# Patient Record
Sex: Female | Born: 1955 | Race: Black or African American | Hispanic: No | Smoking: Former smoker
Health system: Southern US, Community
[De-identification: ages and names within clinical notes are randomized; demographics above are authoritative.]

## PROBLEM LIST (undated history)

## (undated) DIAGNOSIS — I10 Essential (primary) hypertension: Secondary | ICD-10-CM

## (undated) DIAGNOSIS — E079 Disorder of thyroid, unspecified: Secondary | ICD-10-CM

## (undated) HISTORY — PX: KNEE SURGERY: SHX244

## (undated) HISTORY — PX: ABDOMINAL HYSTERECTOMY: SHX81

## (undated) HISTORY — PX: CHOLECYSTECTOMY: SHX55

---

## 2004-08-21 ENCOUNTER — Ambulatory Visit: Payer: Self-pay | Admitting: Family Medicine

## 2005-05-29 ENCOUNTER — Ambulatory Visit: Payer: Self-pay | Admitting: Psychiatry

## 2005-09-23 ENCOUNTER — Ambulatory Visit: Payer: Self-pay | Admitting: Gastroenterology

## 2006-02-03 ENCOUNTER — Ambulatory Visit: Payer: Self-pay | Admitting: Family Medicine

## 2006-04-27 ENCOUNTER — Ambulatory Visit: Payer: Self-pay

## 2008-09-13 ENCOUNTER — Ambulatory Visit: Payer: Self-pay | Admitting: Family Medicine

## 2009-09-19 ENCOUNTER — Ambulatory Visit: Payer: Self-pay | Admitting: Internal Medicine

## 2009-12-24 ENCOUNTER — Ambulatory Visit: Payer: Self-pay | Admitting: Internal Medicine

## 2011-06-21 ENCOUNTER — Ambulatory Visit: Payer: Self-pay

## 2011-06-21 LAB — URINALYSIS, COMPLETE
Bacteria: NEGATIVE
Bilirubin,UR: NEGATIVE
Blood: NEGATIVE
Glucose,UR: NEGATIVE mg/dL (ref 0–75)
Ketone: NEGATIVE
Leukocyte Esterase: NEGATIVE
Nitrite: NEGATIVE
Ph: 6 (ref 4.5–8.0)
Protein: NEGATIVE
RBC,UR: NONE SEEN /HPF (ref 0–5)
Specific Gravity: 1.015 (ref 1.003–1.030)

## 2011-07-22 ENCOUNTER — Ambulatory Visit: Payer: Self-pay | Admitting: Family Medicine

## 2011-09-10 ENCOUNTER — Other Ambulatory Visit: Payer: Self-pay | Admitting: Neurosurgery

## 2011-09-10 DIAGNOSIS — M545 Low back pain: Secondary | ICD-10-CM

## 2012-06-03 ENCOUNTER — Emergency Department: Payer: Self-pay | Admitting: Emergency Medicine

## 2013-09-08 IMAGING — CR DG LUMBAR SPINE 2-3V
1 series · 3 of 3 positions shown · non-contrast
Comparison: none

REASON FOR EXAM: back pain
COMMENTS:

PROCEDURE:     DXR - DXR LUMBAR SPINE AP AND LATERAL  - June 03, 2012  [DATE]
RESULT:     Five non-rib bearing lumbar vertebral bodies are appreciated.
There is no evidence of fracture, dislocation or malalignment.

[Series 1: ap · 0.17mm/px · 3 of 3 slices shown]
[im 1/3]
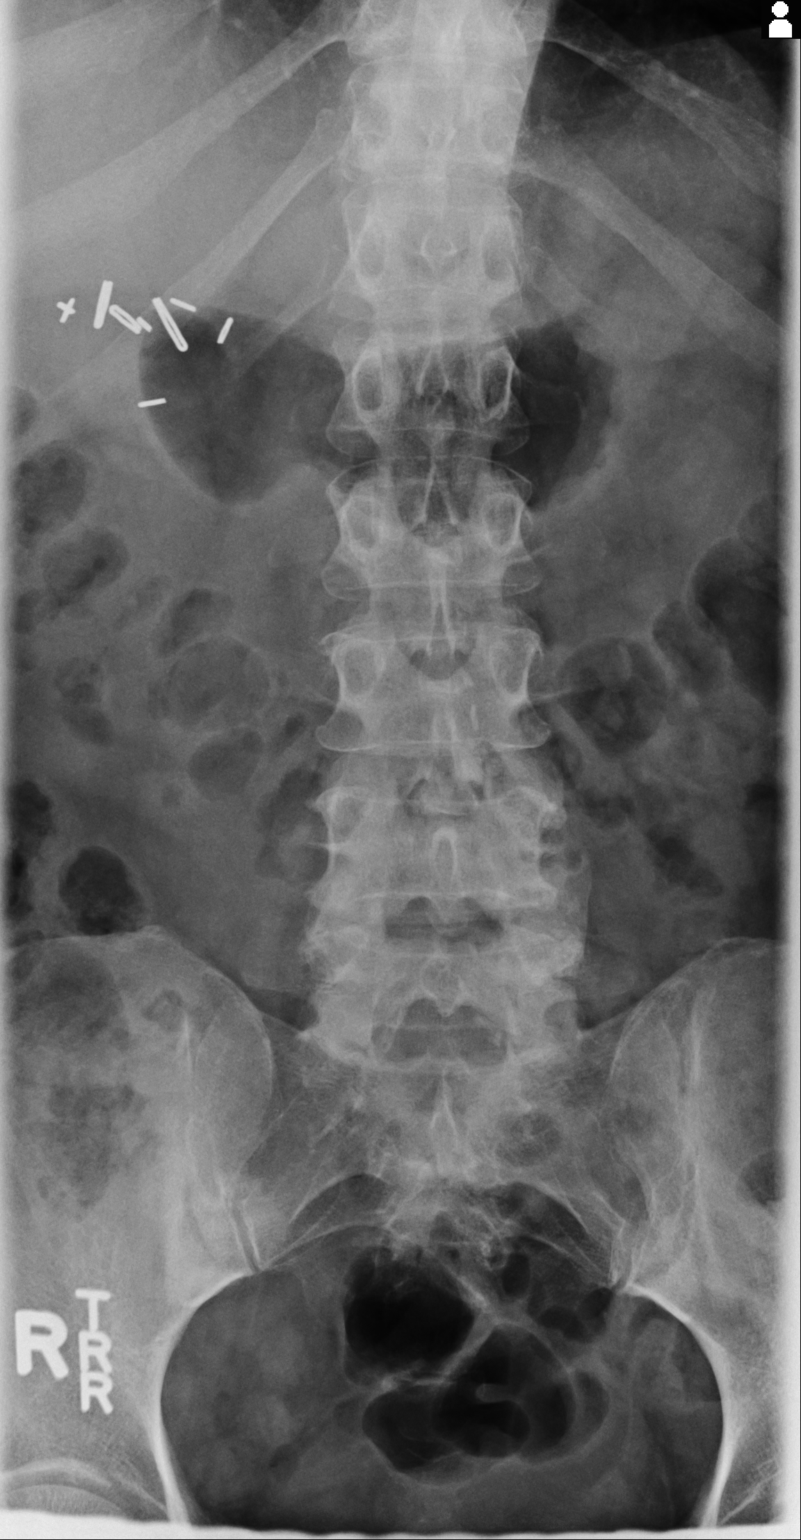
[im 2/3]
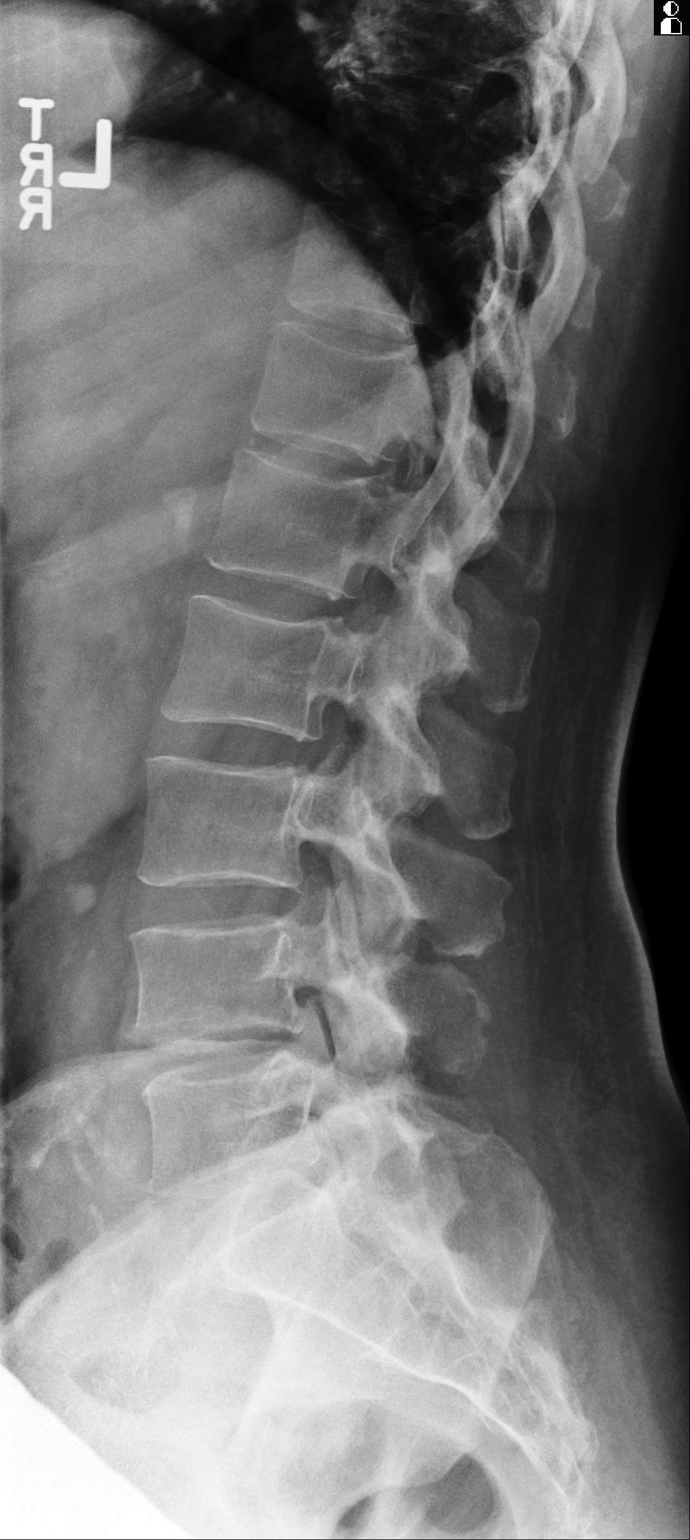
[im 3/3]
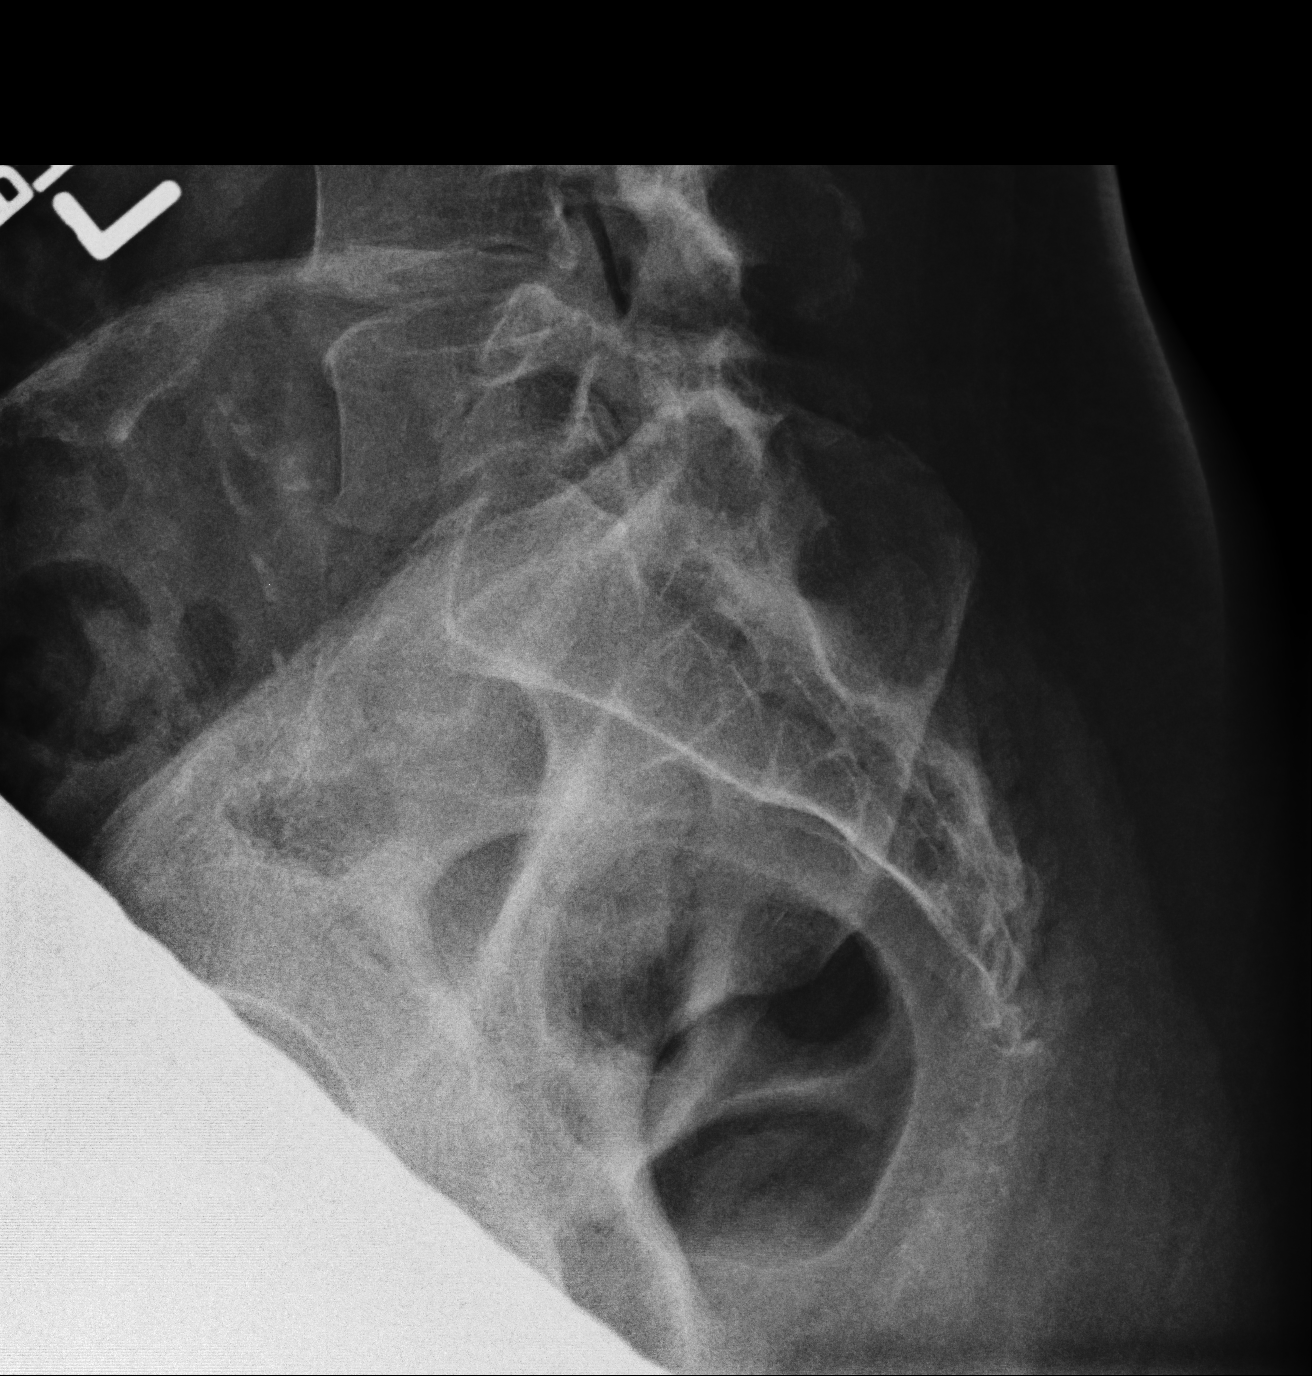

[3 of 3 positions shown; findings below may reference images not displayed]

IMPRESSION: 1. No acute osseous abnormalities.
2. If there is persistent clinical concern or persistent complaints of pain,
further evaluation with MRI is recommended.
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
## 2013-09-08 IMAGING — CR DG CHEST 2V
1 series · 2 of 2 positions shown · non-contrast
Comparison: none

REASON FOR EXAM: chest wall pain
COMMENTS:

PROCEDURE:     DXR - DXR CHEST PA (OR AP) AND LATERAL  - June 03, 2012  [DATE]
RESULT:     The lungs are clear. The cardiac silhouette and visualized bony
skeleton are unremarkable.

[Series 1: pa · 0.17mm/px · 2 of 2 slices shown]
[im 1/2]
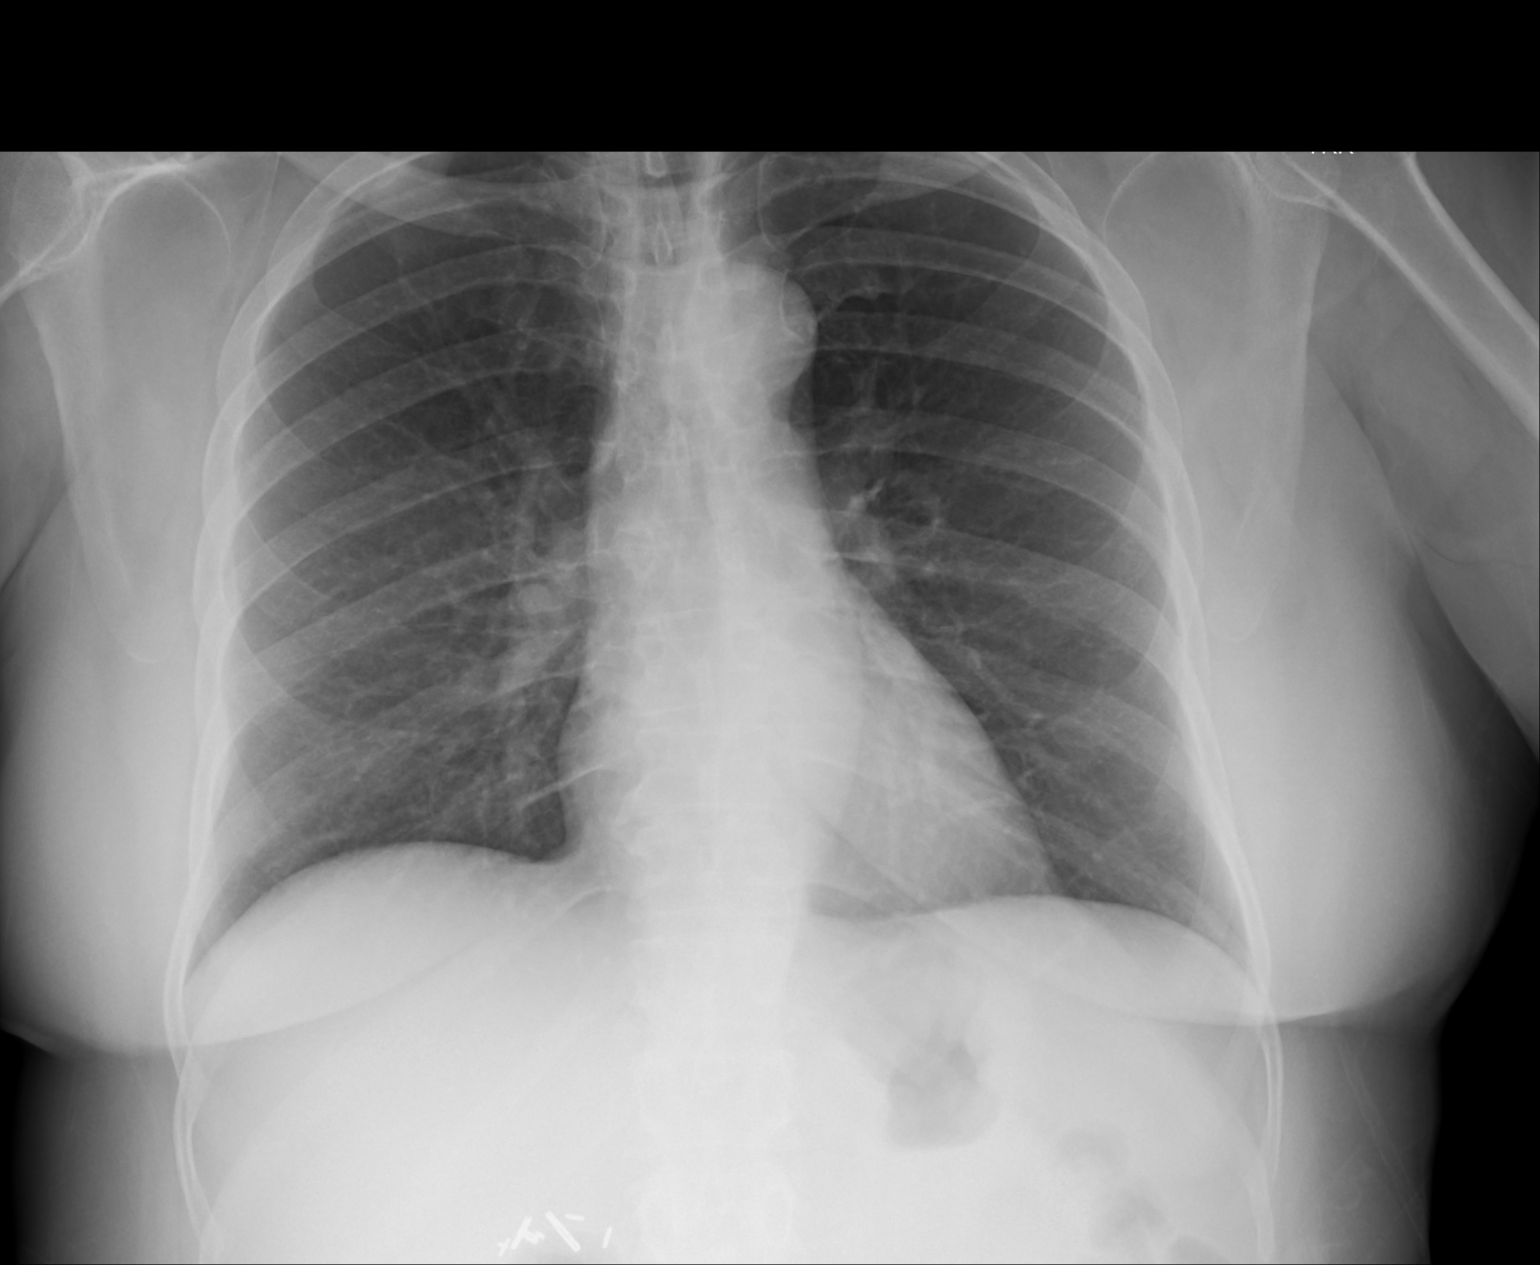
[im 2/2]
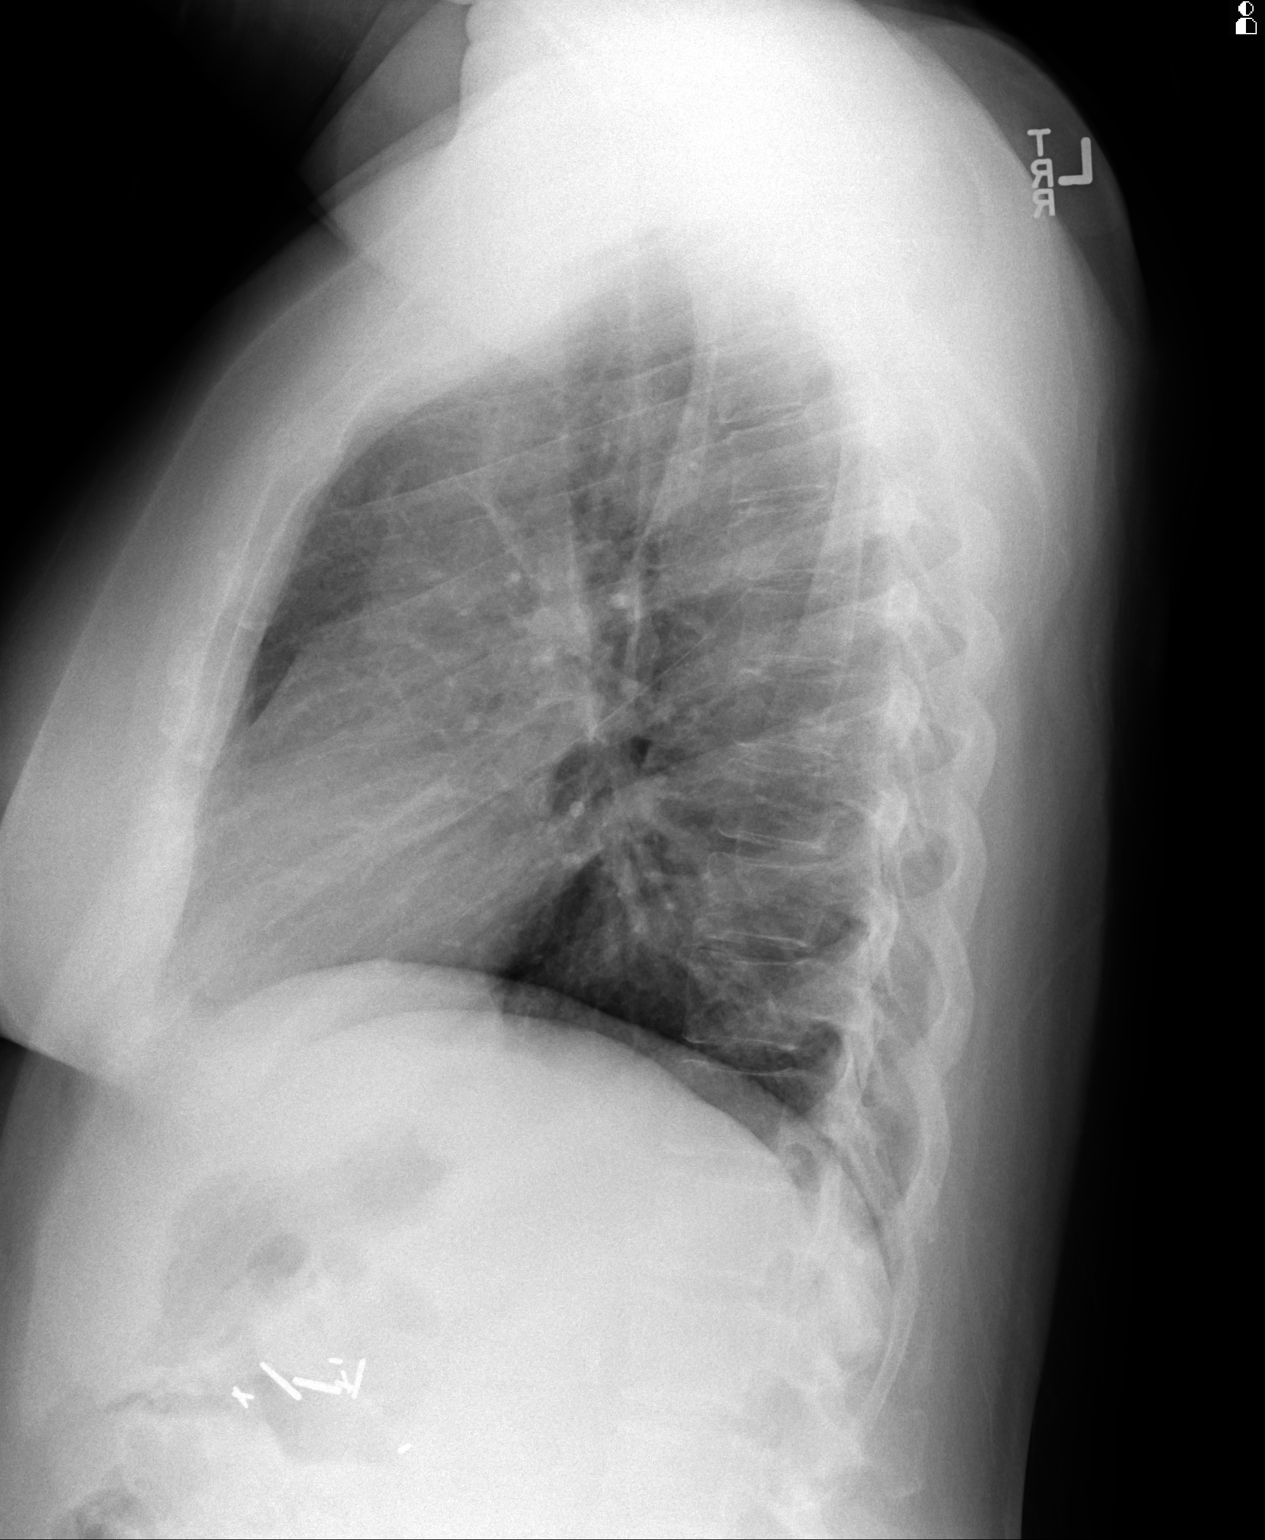

[2 of 2 positions shown; findings below may reference images not displayed]

IMPRESSION: 1. Chest radiograph without evidence of acute cardiopulmonary disease.
2. Comparison made to prior study dated 12/24/2009

## 2015-03-01 ENCOUNTER — Ambulatory Visit: Payer: Self-pay | Admitting: Skilled Nursing Facility1

## 2017-04-16 ENCOUNTER — Other Ambulatory Visit: Payer: Self-pay

## 2017-04-16 ENCOUNTER — Encounter: Payer: Self-pay | Admitting: *Deleted

## 2017-04-16 ENCOUNTER — Ambulatory Visit
Admission: EM | Admit: 2017-04-16 | Discharge: 2017-04-16 | Disposition: A | Discharge status: Home / Self Care | Payer: BC Managed Care – PPO | Attending: Family Medicine | Admitting: Family Medicine

## 2017-04-16 DIAGNOSIS — S46812A Strain of other muscles, fascia and tendons at shoulder and upper arm level, left arm, initial encounter: Secondary | ICD-10-CM

## 2017-04-16 HISTORY — DX: Disorder of thyroid, unspecified: E07.9

## 2017-04-16 HISTORY — DX: Essential (primary) hypertension: I10

## 2017-04-16 MED ORDER — CYCLOBENZAPRINE HCL 10 MG PO TABS: 10.0000 mg | ORAL_TABLET | Freq: Two times a day (BID) | ORAL | 0 refills | Status: DC | PRN

## 2017-04-16 MED ORDER — MELOXICAM 7.5 MG PO TABS: 7.5000 mg | ORAL_TABLET | Freq: Every day | ORAL | 0 refills | Status: DC

## 2017-04-16 NOTE — Discharge Instructions (Signed)
Take medication as prescribed. Rest. Drink plenty of fluids. Stretch.  ° °Follow up with your primary care physician this week as needed. Return to Urgent care for new or worsening concerns.  ° °

## 2017-04-16 NOTE — ED Provider Notes (Addendum)
[facility] ____________________________________________  Time seen: Approximately 10:00 AM  I have reviewed the triage vital signs and the nursing notes.   HISTORY  Chief Complaint Torticollis   HPI Cheryl Ramirez is a 61 y.o. female presenting for evaluation of left-sided neck pain that is been present for the last 2-3 days.  Patient reports pain is increased with trying to look down as well as to look over her left shoulder, and sometimes up.  States she has limited ability to look all the way left due to pain and left neck.  States pain improves at rest and again worsens with movement.  Denies known trigger.  States has been intermittently carrying her grandkids like normal, as well as been very busy and carrying a very heavy purse.  Denies any pain radiation, paresthesias, unilateral weakness, atypical headache, vision changes, shortness of breath, current chest pain, abdominal pain, atypical back pain.  Patient states that she did take some over-the-counter ibuprofen which helped a little bit, no resolution.  Denies history of same in the past.  Denies fall or direct trauma.  States minimal pain at this time.  Denies fevers.  Denies recent sickness. Denies recent antibiotic use.  Patient reports when asked about any chest pain, states that she has had intermittent chest pain that comes and goes without aggravating or alleviating factors present times years and has been following with her primary care physician regarding this.  Denies any current chest pain at this time and denies any recent change in her intermittent chest pain patterns.  Patient states that she is going to be having cardiac evaluation, and again states that she has been following with her primary care physician regarding this.  PCP: [facility]   Past Medical History:  Diagnosis Date  . Hypertension   . Thyroid disease   CVA 7289'  There are no active problems to display for this patient.   Past  Surgical History:  Procedure Laterality Date  . ABDOMINAL HYSTERECTOMY    . CHOLECYSTECTOMY    . KNEE SURGERY Right      No current facility-administered medications for this encounter.   Current Outpatient Medications:  .  hydrochlorothiazide (HYDRODIURIL) 25 MG tablet, Take 25 mg by mouth daily., Disp: , Rfl:  .  levothyroxine (SYNTHROID, LEVOTHROID) 88 MCG tablet, Take 88 mcg by mouth daily before breakfast., Disp: , Rfl:  .  losartan-hydrochlorothiazide (HYZAAR) 50-12.5 MG tablet, Take 1 tablet by mouth daily., Disp: , Rfl:  .  verapamil (CALAN-SR) 180 MG CR tablet, Take 180 mg by mouth at bedtime., Disp: , Rfl:  .  cyclobenzaprine (FLEXERIL) 10 MG tablet, Take 1 tablet (10 mg total) by mouth 2 (two) times daily as needed for muscle spasms. Do not drive while taking as can cause drowsiness, Disp: 15 tablet, Rfl: 0 .  meloxicam (MOBIC) 7.5 MG tablet, Take 1 tablet (7.5 mg total) by mouth daily., Disp: 10 tablet, Rfl: 0  Allergies Patient has no known allergies.  family history Brother: HTN, DM Father Lung CA  Social History Social History   Tobacco Use  . Smoking status: Former Games developermoker  . Smokeless tobacco: Never Used  Substance Use Topics  . Alcohol use: Yes  . Drug use: No    Review of Systems Constitutional: No fever/chills Eyes: No visual changes. ENT: No sore throat. Cardiovascular: Denies chest pain. Respiratory: Denies shortness of breath. Gastrointestinal: No abdominal pain.  No nausea, no vomiting.   Musculoskeletal: Negative for back pain. AS above.  Skin: Negative for rash.   ____________________________________________   PHYSICAL EXAM:  VITAL SIGNS: ED Triage Vitals  Enc Vitals Group     BP 04/16/17 0934 138/86     Pulse Rate 04/16/17 0934 84     Resp 04/16/17 0934 16     Temp 04/16/17 0934 98 F (36.7 C)     Temp Source 04/16/17 0934 Oral     SpO2 04/16/17 0934 100 %     Weight 04/16/17 0936 243 lb (110.2 kg)     Height 04/16/17 0936 5'  5.5" (1.664 m)     Head Circumference --      Peak Flow --      Pain Score 04/16/17 0936 4     Pain Loc --      Pain Edu? --      Excl. in GC? --     Constitutional: Alert and oriented. Well appearing and in no acute distress. Eyes: Conjunctivae are normal. PERRL. EOMI. ENT      Head: Normocephalic and atraumatic.      Nose: No congestion/rhinnorhea.      Mouth/Throat: Mucous membranes are moist.Oropharynx non-erythematous. Neck: No stridor. Supple without meningismus.  Hematological/Lymphatic/Immunilogical: No cervical lymphadenopathy. Cardiovascular: Normal rate, regular rhythm. Grossly normal heart sounds.  Good peripheral circulation. Respiratory: Normal respiratory effort without tachypnea nor retractions. Breath sounds are clear and equal bilaterally. No wheezes, rales, rhonchi. Gastrointestinal: Soft and nontender. No distention. Normal Bowel sounds. No CVA tenderness. Musculoskeletal:  Nontender with normal range of motion in all extremities. No midline cervical, thoracic or lumbar tenderness to palpation. Bilateral distal radial pulses equal and easily palpated. Except: Left trapezius mild to moderate tenderness to direct palpation, palpable muscle spasm present when patient looking the left, pain present to left trapezius with cervical flexion and left rotation as well as palpable muscle spasm, no midline cervical tenderness to palpation, no rash or lesions, no chest tenderness, bilateral upper extremities with full range of motion present, no paresthesias.  Neurologic:  Normal speech and language. No gross focal neurologic deficits are appreciated. Speech is normal. No gait instability.  Skin:  Skin is warm, dry and intact. No rash noted. Psychiatric: Mood and affect are normal. Speech and behavior are normal. Patient exhibits appropriate insight and judgment   ___________________________________________   LABS (all labs ordered are listed, but only abnormal results are  displayed)  Labs Reviewed - No data to display ____________________________________________  RADIOLOGY  No results found. ____________________________________________   PROCEDURES Procedures    INITIAL IMPRESSION / ASSESSMENT AND PLAN / ED COURSE  Pertinent labs & imaging results that were available during my care of the patient were reviewed by me and considered in my medical decision making (see chart for details).  Very well-appearing patient.  No acute distress.  Point left trapezius tenderness and reproducible by direct palpation and with movement.  Suspect trapezius strain.  No midline tenderness.  Will treat patient with Mobic and as needed muscle relaxant.  Encourage stretching, ice and heat, supportive care.  Patient's person room is approximately 15-20 pounds, encourage patient to decrease size reports that she states she does carry it on her left shoulder.  Discussed strict follow-up and return parameters.  Also encourage patient to continue to follow-up with her primary regarding chronic reports of chest pain.  Again denies any chest pain at this time.Discussed indication, risks and benefits of medications with patient.  Discussed follow up with Primary care physician this week. Discussed follow up and return parameters including  no resolution or any worsening concerns. Patient verbalized understanding and agreed to plan.   ____________________________________________   FINAL CLINICAL IMPRESSION(S) / ED DIAGNOSES  Final diagnoses:  Strain of left trapezius muscle, initial encounter     ED Discharge Orders        Ordered    meloxicam (MOBIC) 7.5 MG tablet  Daily     04/16/17 1017    cyclobenzaprine (FLEXERIL) 10 MG tablet  2 times daily PRN     04/16/17 1017       Note: This dictation was prepared with Dragon dictation along with smaller phrase technology. Any transcriptional errors that result from this process are unintentional.         Renford DillsMiller, Takeshi Teasdale,  NP 04/16/17 1030    Renford DillsMiller, Lorenso Quirino, NP 04/16/17 1031

## 2017-04-16 NOTE — ED Triage Notes (Signed)
Patient started having unexplained neck pain 3 days ago.

## 2021-07-02 ENCOUNTER — Other Ambulatory Visit: Payer: Self-pay

## 2021-07-02 ENCOUNTER — Encounter: Payer: Self-pay | Admitting: Emergency Medicine

## 2021-07-02 ENCOUNTER — Ambulatory Visit
Admission: EM | Admit: 2021-07-02 | Discharge: 2021-07-02 | Disposition: A | Discharge status: Home / Self Care | Payer: Medicare HMO | Attending: Emergency Medicine | Admitting: Emergency Medicine

## 2021-07-02 DIAGNOSIS — I10 Essential (primary) hypertension: Secondary | ICD-10-CM

## 2021-07-02 DIAGNOSIS — J069 Acute upper respiratory infection, unspecified: Secondary | ICD-10-CM

## 2021-07-02 MED ORDER — FLUTICASONE PROPIONATE 50 MCG/ACT NA SUSP: 2.0000 | Freq: Every day | NASAL | 0 refills | Status: AC

## 2021-07-02 NOTE — ED Provider Notes (Signed)
HPI ? ?SUBJECTIVE: ? ?Cheryl Ramirez is a 66 y.o. female who presents for a letter to clear her to return to work.  She has had an upper respiratory infection with nasal congestion for the past 4 days.  She has not tried anything for this.  No aggravating or alleviating factors denies fevers.  She has had 3 negative home COVID tests.  She states that overall, she is feeling better.  She has a past medical history of hypertension and thyroid disease. ? ? ? ?Past Medical History:  ?Diagnosis Date  ? Hypertension   ? Thyroid disease   ? ? ?Past Surgical History:  ?Procedure Laterality Date  ? ABDOMINAL HYSTERECTOMY    ? CHOLECYSTECTOMY    ? KNEE SURGERY Right   ? ? ?History reviewed. No pertinent family history. ? ?Social History  ? ?Tobacco Use  ? Smoking status: Former  ? Smokeless tobacco: Never  ?Vaping Use  ? Vaping Use: Never used  ?Substance Use Topics  ? Alcohol use: Yes  ? Drug use: No  ? ? ?No current facility-administered medications for this encounter. ? ?Current Outpatient Medications:  ?  fluticasone (FLONASE) 50 MCG/ACT nasal spray, Place 2 sprays into both nostrils daily., Disp: 16 g, Rfl: 0 ?  hydrochlorothiazide (HYDRODIURIL) 25 MG tablet, Take 25 mg by mouth daily., Disp: , Rfl:  ?  levothyroxine (SYNTHROID, LEVOTHROID) 88 MCG tablet, Take 88 mcg by mouth daily before breakfast., Disp: , Rfl:  ?  losartan-hydrochlorothiazide (HYZAAR) 50-12.5 MG tablet, Take 1 tablet by mouth daily., Disp: , Rfl:  ?  verapamil (CALAN-SR) 180 MG CR tablet, Take 180 mg by mouth at bedtime., Disp: , Rfl:  ? ?No Known Allergies ? ? ?ROS ? ?As noted in HPI.  ? ?Physical Exam ? ?BP (!) 172/97 (BP Location: Left Arm) Comment: hx of htn, took meds. states normally more elevated.  Pulse 78   Temp 98.2 ?F (36.8 ?C) (Oral)   Resp 16   Ht 5' 5.5" (1.664 m)   Wt 110.2 kg   SpO2 95%   BMI 39.81 kg/m?  ? ? ? ?Constitutional: Well developed, well nourished, no acute distress ?Eyes:  EOMI, conjunctiva normal  bilaterally ?HENT: Normocephalic, atraumatic,mucus membranes moist.  Mild nasal congestion.  Positive frontal sinus tenderness.  No maxillary sinus tenderness ?Respiratory: Normal inspiratory effort, lungs clear bilaterally ?Cardiovascular: Normal rate, regular rhythm, no murmurs, rubs, gallop ?GI: nondistended ?skin: No rash, skin intact ?Musculoskeletal: no deformities ?Neurologic: Alert & oriented x 3, no focal neuro deficits ?Psychiatric: Speech and behavior appropriate ? ? ?ED Course ? ? ?Medications - No data to display ? ?No orders of the defined types were placed in this encounter. ? ? ?No results found for this or any previous visit (from the past 24 hour(s)). ?No results found. ? ?ED Clinical Impression ? ?1. Acute upper respiratory infection   ?2. Elevated blood pressure reading with diagnosis of hypertension   ?  ? ?ED Assessment/Plan ? ?1.  URI.  Patient does have some frontal sinus tenderness and nasal congestion, but no clear indications for antibiotics.  Home with Mucinex, saline nasal irrigation, Flonase.  Work note clearing her to return to work.   ? ?2.  Hypertension.  Blood pressure noted.  Patient has no symptoms of hypertensive emergency.  She states that she took her medication this morning.  She has not been on any decongestants recently.  Advised patient to get a blood pressure cuff, to keep a log of her blood pressure  and to follow-up with her primary care provider if it remains persistently above 140/90.  Patient agrees with plan ? ? ?Discussed MDM, treatment plan, and plan for follow-up with patient. Discussed sn/sx that should prompt return to the ED. patient agrees with plan.  ? ?Meds ordered this encounter  ?Medications  ? fluticasone (FLONASE) 50 MCG/ACT nasal spray  ?  Sig: Place 2 sprays into both nostrils daily.  ?  Dispense:  16 g  ?  Refill:  0  ? ? ? ? ?*This clinic note was created using [employer]. Therefore, there may be occasional mistakes despite careful  proofreading. ? ?? ? ?  ?Melynda Ripple, MD ?07/03/21 [phone] ? ?

## 2021-07-02 NOTE — Discharge Instructions (Addendum)
Try some Flonase, saline nasal irrigation with a NeilMed sinus rinse and distilled water as often as you want to help clear up nasal congestion and to prevent an infection.  Plain Mucinex will help keep the mucous thin so you can get it out.  Follow-up with your doctor or return here if not better in 6 days, double sickening, or fevers above 102, we can start you on antibiotic for sinus infection at that time. ? ?Decrease your salt intake. diet and exercise will lower your blood pressure significantly. It is important to keep your blood pressure under good control, as having a elevated blood pressure for prolonged periods of time significantly increases your risk of stroke, heart attacks, kidney damage, eye damage, and other problems. Measure your blood pressure once a day, preferably at the same time every day. Keep a log of this and bring it to your next doctor's appointment.  Bring your blood pressure cuff as well.   Return immediately to the ER if you start having chest pain, headache, problems seeing, problems talking, problems walking, if you feel like you're about to pass out, if you do pass out, if you have a seizure, or for any other concerns. ? ?Go to www.goodrx.com  or www.costplusdrugs.com to look up your medications. This will give you a list of where you can find your prescriptions at the most affordable prices. Or ask the pharmacist what the cash price is, or if they have any other discount programs available to help make your medication more affordable. This can be less expensive than what you would pay with insurance.   ?

## 2021-07-02 NOTE — ED Triage Notes (Signed)
Pt reports she has had a cold and been out of work for 3 days and just requesting a note to return to work. Denies any other complaints.  ?

## 2023-12-24 NOTE — Progress Notes (Deleted)
 Sleep Medicine   Office Visit  Patient Name: Cheryl Ramirez DOB: 1955/11/07 MRN [id number]    Chief Complaint: ***  Brief History:  Murphy presents for an initial consult for sleep evaluation and to establish care. Patient has a 8 year history of sleep apnea and . Sleep quality is ***. This is noted *** night. The patient's bed partner reports  *** at night. The patient relates the following symptoms: *** are also present. The patient goes to sleep at *** and wakes up at ***.  Sleep quality is *** when outside home environment.  Patient has noted *** of her legs at night that would disrupt her sleep.  The patient  relates *** as unusual behavior during the night.  The patient relates  *** as a history of psychiatric problems. The Epworth Sleepiness Score is *** out of 24 .  The patient relates  Cardiovascular risk factors include: *** The patient reports ***    ROS  General: (-) fever, (-) chills, (-) night sweat Nose and Sinuses: (-) nasal stuffiness or itchiness, (-) postnasal drip, (-) nosebleeds, (-) sinus trouble. Mouth and Throat: (-) sore throat, (-) hoarseness. Neck: (-) swollen glands, (-) enlarged thyroid, (-) neck pain. Respiratory: *** cough, *** shortness of breath, *** wheezing. Neurologic: *** numbness, *** tingling. Psychiatric: *** anxiety, *** depression Sleep behavior: ***sleep paralysis ***hypnogogic hallucinations ***dream enactment      ***vivid dreams ***cataplexy ***night terrors ***sleep walking   Current Medication: Outpatient Encounter Medications as of 12/27/2023  Medication Sig   fluticasone  (FLONASE ) 50 MCG/ACT nasal spray Place 2 sprays into both nostrils daily.   hydrochlorothiazide (HYDRODIURIL) 25 MG tablet Take 25 mg by mouth daily.   levothyroxine (SYNTHROID, LEVOTHROID) 88 MCG tablet Take 88 mcg by mouth daily before breakfast.   losartan-hydrochlorothiazide (HYZAAR) 50-12.5 MG tablet Take 1 tablet by mouth daily.   verapamil (CALAN-SR) 180 MG  CR tablet Take 180 mg by mouth at bedtime.   No facility-administered encounter medications on file as of 12/27/2023.    Surgical History: Past Surgical History:  Procedure Laterality Date   ABDOMINAL HYSTERECTOMY     CHOLECYSTECTOMY     KNEE SURGERY Right     Medical History: Past Medical History:  Diagnosis Date   Hypertension    Thyroid disease     Family History: Non contributory to the present illness  Social History: Social History   Socioeconomic History   Marital status: Divorced    Spouse name: Not on file   Number of children: Not on file   Years of education: Not on file   Highest education level: Not on file  Occupational History   Not on file  Tobacco Use   Smoking status: Former   Smokeless tobacco: Never  Vaping Use   Vaping status: Never Used  Substance and Sexual Activity   Alcohol use: Yes   Drug use: No   Sexual activity: Not on file  Other Topics Concern   Not on file  Social History Narrative   Not on file   Social Drivers of Health   Financial Resource Strain: Medium Risk (07/21/2022)   Received from [facility]   Overall Financial Resource Strain (CARDIA)    Difficulty of Paying Living Expenses: Somewhat hard  Food Insecurity: No Food Insecurity (07/21/2022)   Received from [facility]   Hunger Vital Sign    Within the past 12 months, you worried that your food would run out before you got the money to  buy more.: Never true    Within the past 12 months, the food you bought just didn't last and you didn't have money to get more.: Never true  Transportation Needs: No Transportation Needs (07/21/2022)   Received from [facility] - Transportation    Lack of Transportation (Medical): No    Lack of Transportation (Non-Medical): No  Physical Activity: Insufficiently Active (11/19/2021)   Received from [facility]   Exercise Vital Sign    On average, how many days per week do you engage in moderate to strenuous exercise  (like a brisk walk)?: 2 days    On average, how many minutes do you engage in exercise at this level?: 60 min  Stress: Stress Concern Present (11/19/2021)   Received from [facility] - Occupational Stress Questionnaire    Feeling of Stress : To some extent  Social Connections: Unknown (11/19/2021)   Received from [facility]   Social Connection and Isolation Panel    In a typical week, how many times do you talk on the phone with family, friends, or neighbors?: More than three times a week    How often do you get together with friends or relatives?: More than three times a week    How often do you attend church or religious services?: More than 4 times per year    Do you belong to any clubs or organizations such as church groups, unions, fraternal or athletic groups, or school groups?: Yes    How often do you attend meetings of the clubs or organizations you belong to?: More than 4 times per year    Are you married, widowed, divorced, separated, never married, or living with a partner?: Patient declined  Intimate Partner Violence: Not At Risk (08/17/2023)   Received from [facility]   Humiliation, Afraid, Rape, and Kick questionnaire    Within the last year, have you been afraid of your partner or ex-partner?: No    Within the last year, have you been humiliated or emotionally abused in other ways by your partner or ex-partner?: No    Within the last year, have you been kicked, hit, slapped, or otherwise physically hurt by your partner or ex-partner?: No    Within the last year, have you been raped or forced to have any kind of sexual activity by your partner or ex-partner?: No    Vital Signs: There were no vitals taken for this visit. There is no height or weight on file to calculate BMI.   Examination: General Appearance: The patient is well-developed, well-nourished, and in no distress. Neck Circumference: *** Skin: Gross inspection  of skin unremarkable. Head: normocephalic, no gross deformities. Eyes: no gross deformities noted. ENT: ears appear grossly normal Neurologic: Alert and oriented. No involuntary movements.    STOP BANG RISK ASSESSMENT S (snore) Have you been told that you snore?     YES/N   T (tired) Are you often tired, fatigued, or sleepy during the day?   YES/NO  O (obstruction) Do you stop breathing, choke, or gasp during sleep? YES/NO   P (pressure) Do you have or are you being treated for high blood pressure? YES/NO   B (BMI) Is your body index greater than 35 kg/m? YES/NO   A (age) Are you 33 years old or older? YES   N (neck) Do you have a neck circumference greater than 16 inches?   YES/NO  G (gender) Are you a female? NO   TOTAL STOP/BANG "YES" ANSWERS                                                                A STOP-Bang score of 2 or less is considered low risk, and a score of 5 or more is high risk for having either moderate or severe OSA. For people who score 3 or 4, doctors may need to perform further assessment to determine how likely they are to have OSA.         EPWORTH SLEEPINESS SCALE:  Scale:  (0)= no chance of dozing; (1)= slight chance of dozing; (2)= moderate chance of dozing; (3)= high chance of dozing  Chance  Situtation    Sitting and reading: ***    Watching TV: ***    Sitting Inactive in public: ***    As a passenger in car: ***      Lying down to rest: ***    Sitting and talking: ***    Sitting quielty after lunch: ***    In a car, stopped in traffic: ***   TOTAL SCORE:   *** out of 24     SLEEP STUDIES:  PSG (12/2015 at [facility]) AHI 25/hr, REM AHI 72/hr, min SpO2 78% Titration (04/2018 at [facility]) CPAP@ 14 cmH2O   LABS: No results found for this or any previous visit (from the past 2160 hours).  Radiology: No results found.  No results found.  No results found.    Assessment and Plan: There are no active problems to display for  this patient.    PLAN OSA:   Patient evaluation suggests high risk of sleep disordered breathing due to *** Patient has comorbid cardiovascular risk factors including: *** which could be exacerbated by pathologic sleep-disordered breathing.  Suggest: *** to assess/treat the patient's sleep disordered breathing. The patient was also counselled on *** to optimize sleep health.  PLAN hypersomnia:  Patient evaluation suggests significant daytime hypersomnia.  The Epworth Sleepiness Score is elevated at *** out of 24. Patient *** drowsy driving. The patient *** MVA due to sleepiness.  The patient *** restless leg symptoms which exacerbate *** for *** nights per week. The patient *** periodic limb movements which exacerbate ***  for *** nights per week. Suggest: ***  Also suggest ***  PLAN insomnia:  Patient evaluation suggests *** insomnia. This is a chronic disorder. This has been a concern for *** and causes impaired daytime functioning. The patient exhibits comorbid ***  The history *** suggest the insomnia predates the use of hypnotic medications. The symptoms *** with the discontinuation of these medications. There is no obvious medical, psychiatric or pharmacologic abuse issues ot account for the insomnia.  Treatment recommendations include: *** The patient should maintain a sleep log and calculate total sleep time for 1-2 weeks. Set bed and wake times for achieve 85% sleep efficiency for one week. Once this is achieved  time in bed can be gradually increased. A pharmacologic treatment approach would include a trial of *** for the next ***  months. During this time the patient is to maintain a sleep diary to track progress.    ***  General Counseling: I have discussed the findings of the evaluation and examination with Montie.  I have also discussed any further diagnostic evaluation thatmay be needed or ordered today. Ronnell verbalizes understanding of the findings of todays visit. We  also reviewed her medications today and discussed drug interactions and side effects including but not limited excessive drowsiness and altered mental states. We also discussed that there is always a risk not just to her but also people around her. she has been encouraged to call the office with any questions or concerns that should arise related to todays visit.  No orders of the defined types were placed in this encounter.       I have personally obtained a history, evaluated the patient, evaluated pertinent data, formulated the assessment and plan and placed orders.    Elfreda DELENA Bathe, MD [facility] Diplomate ABMS Pulmonary and Critical Care Medicine Sleep medicine

## 2023-12-27 ENCOUNTER — Ambulatory Visit: Payer: Self-pay
